# Patient Record
Sex: Female | Born: 1945 | Race: White | Hispanic: No | Smoking: Never smoker
Health system: Southern US, Community
[De-identification: ages and names within clinical notes are randomized; demographics above are authoritative.]

## PROBLEM LIST (undated history)

## (undated) DIAGNOSIS — C439 Malignant melanoma of skin, unspecified: Secondary | ICD-10-CM

## (undated) DIAGNOSIS — I10 Essential (primary) hypertension: Secondary | ICD-10-CM

## (undated) DIAGNOSIS — C801 Malignant (primary) neoplasm, unspecified: Secondary | ICD-10-CM

## (undated) DIAGNOSIS — I4891 Unspecified atrial fibrillation: Secondary | ICD-10-CM

## (undated) DIAGNOSIS — M1990 Unspecified osteoarthritis, unspecified site: Secondary | ICD-10-CM

## (undated) HISTORY — PX: ABDOMINAL HYSTERECTOMY: SHX81

## (undated) HISTORY — PX: TONSILLECTOMY: SUR1361

## (undated) HISTORY — PX: BUNIONECTOMY: SHX129

## (undated) HISTORY — PX: OTHER SURGICAL HISTORY: SHX169

## (undated) HISTORY — PX: LYMPH NODE BIOPSY: SHX201

---

## 2018-01-23 ENCOUNTER — Emergency Department (HOSPITAL_COMMUNITY): Payer: Medicare Other

## 2018-01-23 ENCOUNTER — Encounter (HOSPITAL_COMMUNITY): Payer: Self-pay | Admitting: Obstetrics and Gynecology

## 2018-01-23 ENCOUNTER — Other Ambulatory Visit: Payer: Self-pay

## 2018-01-23 ENCOUNTER — Observation Stay (HOSPITAL_COMMUNITY)
Admission: EM | Admit: 2018-01-23 | Discharge: 2018-01-24 | Disposition: A | Discharge status: Home / Self Care | Payer: Medicare Other | Attending: Interventional Cardiology | Admitting: Interventional Cardiology

## 2018-01-23 DIAGNOSIS — Z7901 Long term (current) use of anticoagulants: Secondary | ICD-10-CM | POA: Diagnosis not present

## 2018-01-23 DIAGNOSIS — I272 Pulmonary hypertension, unspecified: Secondary | ICD-10-CM | POA: Diagnosis not present

## 2018-01-23 DIAGNOSIS — Z79899 Other long term (current) drug therapy: Secondary | ICD-10-CM | POA: Diagnosis not present

## 2018-01-23 DIAGNOSIS — R05 Cough: Secondary | ICD-10-CM | POA: Diagnosis not present

## 2018-01-23 DIAGNOSIS — I110 Hypertensive heart disease with heart failure: Secondary | ICD-10-CM | POA: Insufficient documentation

## 2018-01-23 DIAGNOSIS — Z85820 Personal history of malignant melanoma of skin: Secondary | ICD-10-CM | POA: Diagnosis not present

## 2018-01-23 DIAGNOSIS — I4891 Unspecified atrial fibrillation: Secondary | ICD-10-CM

## 2018-01-23 DIAGNOSIS — I509 Heart failure, unspecified: Secondary | ICD-10-CM

## 2018-01-23 DIAGNOSIS — R0602 Shortness of breath: Secondary | ICD-10-CM | POA: Insufficient documentation

## 2018-01-23 DIAGNOSIS — M1990 Unspecified osteoarthritis, unspecified site: Secondary | ICD-10-CM | POA: Insufficient documentation

## 2018-01-23 DIAGNOSIS — J90 Pleural effusion, not elsewhere classified: Secondary | ICD-10-CM | POA: Insufficient documentation

## 2018-01-23 DIAGNOSIS — I4819 Other persistent atrial fibrillation: Principal | ICD-10-CM | POA: Insufficient documentation

## 2018-01-23 HISTORY — DX: Malignant melanoma of skin, unspecified: C43.9

## 2018-01-23 HISTORY — DX: Malignant (primary) neoplasm, unspecified: C80.1

## 2018-01-23 HISTORY — DX: Essential (primary) hypertension: I10

## 2018-01-23 HISTORY — DX: Unspecified osteoarthritis, unspecified site: M19.90

## 2018-01-23 HISTORY — DX: Unspecified atrial fibrillation: I48.91

## 2018-01-23 LAB — COMPREHENSIVE METABOLIC PANEL
ALT: 29 U/L (ref 0–44)
ANION GAP: 9 (ref 5–15)
AST: 27 U/L (ref 15–41)
Albumin: 4.2 g/dL (ref 3.5–5.0)
Alkaline Phosphatase: 52 U/L (ref 38–126)
BILIRUBIN TOTAL: 0.8 mg/dL (ref 0.3–1.2)
BUN: 18 mg/dL (ref 8–23)
CO2: 25 mmol/L (ref 22–32)
Calcium: 8.7 mg/dL — ABNORMAL LOW (ref 8.9–10.3)
Chloride: 107 mmol/L (ref 98–111)
Creatinine, Ser: 0.7 mg/dL (ref 0.44–1.00)
GFR calc Af Amer: >60 mL/min (ref >60)
GFR calc non Af Amer: >60 mL/min (ref >60)
GLUCOSE: 95 mg/dL (ref 70–99)
POTASSIUM: 3.3 mmol/L — AB (ref 3.5–5.1)
SODIUM: 141 mmol/L (ref 135–145)
Total Protein: 6.3 g/dL — ABNORMAL LOW (ref 6.5–8.1)

## 2018-01-23 LAB — CBC
HEMATOCRIT: 39.9 % (ref 36.0–46.0)
Hemoglobin: 12.9 g/dL (ref 12.0–15.0)
MCH: 33.2 pg (ref 26.0–34.0)
MCHC: 32.3 g/dL (ref 30.0–36.0)
MCV: 102.8 fL — ABNORMAL HIGH (ref 80.0–100.0)
NRBC: 0 % (ref 0.0–0.2)
Platelets: 168 10*3/uL (ref 150–400)
RBC: 3.88 MIL/uL (ref 3.87–5.11)
RDW: 13.9 % (ref 11.5–15.5)
WBC: 10.4 10*3/uL (ref 4.0–10.5)

## 2018-01-23 LAB — PROTIME-INR
INR: 2.34
PROTHROMBIN TIME: 25.3 s — AB (ref 11.4–15.2)

## 2018-01-23 LAB — TROPONIN I: Troponin I: <0.03 ng/mL (ref <0.03)

## 2018-01-23 LAB — BRAIN NATRIURETIC PEPTIDE: B Natriuretic Peptide: 1099.4 pg/mL — ABNORMAL HIGH (ref 0.0–100.0)

## 2018-01-23 MED ORDER — ACETAMINOPHEN 325 MG PO TABS: 650.0000 mg | ORAL_TABLET | ORAL | Status: DC | PRN

## 2018-01-23 MED ORDER — SOTALOL HCL 80 MG PO TABS
80.0000 mg | ORAL_TABLET | Freq: Two times a day (BID) | ORAL | Status: DC
  Administered 2018-01-23 – 2018-01-24 (×2): 80 mg via ORAL
  Filled 2018-01-23 (×3): qty 1

## 2018-01-23 MED ORDER — PANTOPRAZOLE SODIUM 40 MG PO TBEC
40.0000 mg | DELAYED_RELEASE_TABLET | Freq: Every day | ORAL | Status: DC
  Administered 2018-01-24: 40 mg via ORAL
  Filled 2018-01-23: qty 1

## 2018-01-23 MED ORDER — CITALOPRAM HYDROBROMIDE 20 MG PO TABS
20.0000 mg | ORAL_TABLET | Freq: Every day | ORAL | Status: DC
  Administered 2018-01-24: 20 mg via ORAL
  Filled 2018-01-23: qty 1

## 2018-01-23 MED ORDER — POTASSIUM CHLORIDE CRYS ER 20 MEQ PO TBCR
40.0000 meq | EXTENDED_RELEASE_TABLET | Freq: Once | ORAL | Status: AC
  Administered 2018-01-23: 40 meq via ORAL
  Filled 2018-01-23: qty 2

## 2018-01-23 MED ORDER — ACETAMINOPHEN 500 MG PO TABS: 1000.0000 mg | ORAL_TABLET | Freq: Every evening | ORAL | Status: DC | PRN

## 2018-01-23 MED ORDER — B COMPLEX-C PO TABS
1.0000 | ORAL_TABLET | Freq: Every day | ORAL | Status: DC
  Administered 2018-01-24: 1 via ORAL
  Filled 2018-01-23: qty 1

## 2018-01-23 MED ORDER — DILTIAZEM HCL-DEXTROSE 100-5 MG/100ML-% IV SOLN (PREMIX)
5.0000 mg/h | INTRAVENOUS | Status: DC
  Administered 2018-01-23: 5 mg/h via INTRAVENOUS
  Filled 2018-01-23: qty 100

## 2018-01-23 MED ORDER — WARFARIN - PHARMACIST DOSING INPATIENT
Freq: Every day | Status: DC
  Administered 2018-01-23: 22:00:00

## 2018-01-23 MED ORDER — AMOXICILLIN-POT CLAVULANATE 875-125 MG PO TABS
1.0000 | ORAL_TABLET | Freq: Two times a day (BID) | ORAL | Status: DC
  Administered 2018-01-23 – 2018-01-24 (×2): 1 via ORAL
  Filled 2018-01-23 (×3): qty 1

## 2018-01-23 MED ORDER — FUROSEMIDE 10 MG/ML IJ SOLN
40.0000 mg | Freq: Once | INTRAMUSCULAR | Status: AC
  Administered 2018-01-23: 40 mg via INTRAVENOUS
  Filled 2018-01-23: qty 4

## 2018-01-23 MED ORDER — WARFARIN SODIUM 2 MG PO TABS
2.0000 mg | ORAL_TABLET | Freq: Every day | ORAL | Status: DC
  Administered 2018-01-23: 2 mg via ORAL
  Filled 2018-01-23: qty 1

## 2018-01-23 MED ORDER — DILTIAZEM LOAD VIA INFUSION
15.0000 mg | Freq: Once | INTRAVENOUS | Status: AC
  Administered 2018-01-23: 15 mg via INTRAVENOUS
  Filled 2018-01-23: qty 15

## 2018-01-23 MED ORDER — DILTIAZEM HCL-DEXTROSE 100-5 MG/100ML-% IV SOLN (PREMIX)
5.0000 mg/h | INTRAVENOUS | Status: DC
  Administered 2018-01-23: 5 mg/h via INTRAVENOUS
  Filled 2018-01-23 (×2): qty 100

## 2018-01-23 MED ORDER — ONDANSETRON HCL 4 MG/2ML IJ SOLN: 4.0000 mg | Freq: Four times a day (QID) | INTRAMUSCULAR | Status: DC | PRN

## 2018-01-23 MED ORDER — METHYLPREDNISOLONE 4 MG PO TABS
4.0000 mg | ORAL_TABLET | Freq: Every day | ORAL | Status: DC
  Administered 2018-01-24: 4 mg via ORAL
  Filled 2018-01-23: qty 1

## 2018-01-23 MED ORDER — DILTIAZEM HCL ER COATED BEADS 120 MG PO CP24
120.0000 mg | ORAL_CAPSULE | Freq: Once | ORAL | Status: AC
  Administered 2018-01-23: 120 mg via ORAL
  Filled 2018-01-23: qty 1

## 2018-01-23 NOTE — ED Provider Notes (Signed)
[facility] DEPT Provider Note   CSN: [id number] Arrival date & time: 01/23/18  1046     History   Chief Complaint Chief Complaint  Patient presents with  . Cough  . Shortness of Breath  . Atrial Fibrillation    HPI Jacqueline Cochran is a 72 y.o. female.  HPI  6wk of atrial fibrillation, associated shortness of breath, hospitalization, attempted cardioversions For last 5 days shortness of breath has been worse, cough has worsened, productive of white/yellow, not coughing up blood Twinge of chest pain, no significant presesure, chest pain with cough. On augmentin and prednisone since Saturday but no relief. On coumadin No fever No leg pain or swelling Thought laying on left sided made symptoms better, sitting up more  Visiting from [place], flew, had been hospitalized last week at [facility] in [place] 11/13 is flight home  Sotalol 80mg  Sept 20 was first admission/cardioversion, then Oct 25th was second admission/cardioversion [employer] Coumadin When discharged was back in rhythm Had cardioverted and then discharged, then had return, then admitted again last weekend, gave sotalol didn't help, cardioverted again with improved, then came back  Dr. Billy Fischer is Cardiologist in [place]    Past Medical History:  Diagnosis Date  . A-fib ([place])   . Arthritis    RA  . 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 Hypertension   . Melanoma [facility])     Patient Active Problem List   Diagnosis Date Noted  . Atrial fibrillation ([place]) 01/23/2018    Past Surgical History:  Procedure Laterality Date  . ABDOMINAL HYSTERECTOMY    . BUNIONECTOMY    . Cyst removed from toe    . LYMPH NODE BIOPSY    . TONSILLECTOMY       OB History    Gravida      Para      Term      Preterm      AB      Living  1     SAB      TAB      Ectopic      Multiple      Live Births               Home Medications    Prior to Admission medications   Medication Sig Start Date  End Date Taking? Authorizing Provider  acetaminophen (TYLENOL) 500 MG tablet Take 1,000 mg by mouth at bedtime as needed (rest).   Yes [provider]  acetaminophen (TYLENOL) 650 MG CR tablet Take 1,300 mg by mouth at bedtime as needed for pain.   Yes [provider]  amoxicillin-clavulanate (AUGMENTIN) 875-125 MG tablet Take 1 tablet by mouth 2 (two) times daily.   Yes [provider]  B Complex Vitamins (B-COMPLEX/B-12) TABS Take 1 tablet by mouth daily. 06/27/14  Yes [provider]  citalopram (CELEXA) 20 MG tablet Take 20 mg by mouth daily.   Yes [provider]  methylPREDNISolone (MEDROL) 4 MG tablet Take 4 mg by mouth daily.   Yes [provider]  Misc Natural Products (OSTEO BI-FLEX JOINT SHIELD PO) Take 1 tablet by mouth 2 (two) times daily.   Yes [provider]  pantoprazole (PROTONIX) 40 MG tablet Take 40 mg by mouth daily. 06/27/14  Yes [provider]  riTUXimab (RITUXAN) 500 MG/50ML injection Inject 10 mg into the vein. Every 8 weeks   Yes [provider]  sotalol (BETAPACE) 80 MG tablet Take 80 mg by  mouth 2 (two) times daily.   Yes [provider]  TURMERIC PO Take 1 tablet by mouth daily.   Yes [provider]  VITAMIN E PO Take 1 tablet by mouth daily. 06/27/14  Yes [provider]  warfarin (COUMADIN) 2 MG tablet Take 2 mg by mouth daily.   Yes [provider]    Family History Family History  Problem Relation Age of Onset  . Aneurysm Mother   . Stroke Father     Social History Social History   Tobacco Use  . Smoking status: Never Smoker  . Smokeless tobacco: Never Used  Substance Use Topics  . Alcohol use: Yes    Comment: Socially  . Drug use: Never     Allergies   Codeine   Review of Systems Review of Systems  Constitutional: Negative for fever.  HENT: Negative for congestion and sore throat.   Eyes: Negative for visual disturbance.    Respiratory: Positive for cough and shortness of breath.   Cardiovascular: Positive for chest pain. Negative for leg swelling.  Gastrointestinal: Positive for diarrhea (chronic intermittent). Negative for abdominal pain, nausea and vomiting.  Genitourinary: Negative for difficulty urinating.  Musculoskeletal: Negative for back pain and neck pain.  Skin: Negative for rash.  Neurological: Positive for light-headedness. Negative for syncope and headaches.     Physical Exam Updated Vital Signs BP 129/90 (BP Location: Right Arm)   Pulse 82   Temp 97.9 F (36.6 C) (Oral)   Resp (!) 22   Ht 5\' 4"  (1.626 m)   Wt 70.3 kg   SpO2 99%   BMI 26.61 kg/m   Physical Exam  Constitutional: She is oriented to person, place, and time. She appears well-developed and well-nourished. No distress.  HENT:  Head: Normocephalic and atraumatic.  Eyes: Conjunctivae and EOM are normal.  Neck: Normal range of motion.  Cardiovascular: Normal heart sounds and intact distal pulses. An irregularly irregular rhythm present. Tachycardia present. Exam reveals no gallop and no friction rub.  No murmur heard. Pulmonary/Chest: Effort normal and breath sounds normal. No respiratory distress. She has no wheezes. She has no rales.  Abdominal: Soft. She exhibits no distension. There is no tenderness. There is no guarding.  Musculoskeletal: She exhibits no edema or tenderness.  Neurological: She is alert and oriented to person, place, and time.  Skin: Skin is warm and dry. No rash noted. She is not diaphoretic. No erythema.  Nursing note and vitals reviewed.    ED Treatments / Results  Labs (all labs ordered are listed, but only abnormal results are displayed) Labs Reviewed  CBC - Abnormal; Notable for the following components:      Result Value   MCV 102.8 (*)    All other components within normal limits  COMPREHENSIVE METABOLIC PANEL - Abnormal; Notable for the following components:   Potassium 3.3 (*)     Calcium 8.7 (*)    Total Protein 6.3 (*)    All other components within normal limits  BRAIN NATRIURETIC PEPTIDE - Abnormal; Notable for the following components:   B Natriuretic Peptide 1,099.4 (*)    All other components within normal limits  PROTIME-INR - Abnormal; Notable for the following components:   Prothrombin Time 25.3 (*)    All other components within normal limits  TROPONIN I  PROTIME-INR  BASIC METABOLIC PANEL    EKG EKG Interpretation  Date/Time:  Tuesday January 23 2018 11:25:05 EST Ventricular Rate:  128 PR Interval:    QRS  Duration: 87 QT Interval:  281 QTC Calculation: 410 R Axis:   84 Text Interpretation:  Atrial fibrillation Borderline right axis deviation Borderline repolarization abnormality No previous ECGs available Confirmed by Gareth Morgan [id number]) on 01/23/2018 3:08:05 PM   Radiology Dg Chest 2 View  Result Date: 01/23/2018 CLINICAL DATA:  Shortness of breath, cough. EXAM: CHEST - 2 VIEW COMPARISON:  None. FINDINGS: Mild cardiomegaly is noted. Left subclavian catheter is noted with distal tip in expected position of the SVC. Mild central pulmonary vascular congestion is noted. No pneumothorax is noted. Small left pleural effusion is noted. Bony thorax is unremarkable. IMPRESSION: Mild cardiomegaly is noted with mild central pulmonary vascular congestion. Small left pleural effusion is noted. Electronically Signed   By: Marijo Conception, M.D.   On: 01/23/2018 11:23    Procedures .Critical Care Performed by: Gareth Morgan, MD Authorized by: Gareth Morgan, MD   Critical care provider statement:    Critical care time (minutes):  35   Critical care was necessary to treat or prevent imminent or life-threatening deterioration of the following conditions:  Cardiac failure   Critical care was time spent personally by me on the following activities:  Development of treatment plan with patient or surrogate, discussions with consultants, examination of  patient, evaluation of patient's response to treatment, ordering and review of radiographic studies, ordering and review of laboratory studies, pulse oximetry, re-evaluation of patient's condition, review of old charts and ordering and performing treatments and interventions   (including critical care time)  Medications Ordered in ED Medications  sotalol (BETAPACE) tablet 80 mg (has no administration in time range)  citalopram (CELEXA) tablet 20 mg (has no administration in time range)  methylPREDNISolone (MEDROL) tablet 4 mg (has no administration in time range)  pantoprazole (PROTONIX) EC tablet 40 mg (has no administration in time range)  B-Complex/B-12 TABS 1 tablet (has no administration in time range)  amoxicillin-clavulanate (AUGMENTIN) 875-125 MG per tablet 1 tablet (has no administration in time range)  acetaminophen (TYLENOL) tablet 650 mg (has no administration in time range)  ondansetron (ZOFRAN) injection 4 mg (has no administration in time range)  diltiazem (CARDIZEM) 100 mg in dextrose 5% 157mL (1 mg/mL) infusion (has no administration in time range)  warfarin (COUMADIN) tablet 2 mg (has no administration in time range)  Warfarin - Pharmacist Dosing Inpatient (has no administration in time range)  diltiazem (CARDIZEM) 1 mg/mL load via infusion 15 mg (15 mg Intravenous Bolus from Bag 01/23/18 1159)  diltiazem (CARDIZEM CD) 24 hr capsule 120 mg (120 mg Oral Given 01/23/18 1502)  furosemide (LASIX) injection 40 mg (40 mg Intravenous Given 01/23/18 1625)  potassium chloride SA (K-DUR,KLOR-CON) CR tablet 40 mEq (40 mEq Oral Given 01/23/18 1625)     Initial Impression / Assessment and Plan / ED Course  I have reviewed the triage vital signs and the nursing notes.  Pertinent labs & imaging results that were available during my care of the patient were reviewed by me and considered in my medical decision making (see chart for details).     72 year old female with a history of atrial  fibrillation on Coumadin and sotalol, hypertension, lymphoma on Rituxan, presents patient was recently started on antibiotics and steroid dose of breath and cough, however is no improvement.  Chest x-ray shows no evidence of pneumonia nor pneumothorax, does show cardiomegaly with central vascular congestion and left pleural effusion.   EKG shows atrial fibrillation with RVR, rate up to 140s-150s. Blood pressures stable.  Doubt PE  in setting of anticoagulation, no asymmetric leg swelling, and pt with known hx of atrial fibrillation.  BNP elevated to 1099 without prior for comparison.  While cough may represent URI which may have been trigger for atrial fibrillation, given no other URI symptoms, no fever, no WBC, I doubt pneumonia and overall feel likely etiology of her cough and dyspnea is afib with RVR with fluid overload related to atrial fibrillation.   Given diltiazem bolus and gtt.  Rate controlled after given medications, and patient initially reported feeling better, however she then noted having significant dyspnea with minimal activity such as using the commode and was found to have desaturation to 87% on room air with ambulation and HR increase again to 150s. Given lasix and po diltiazem to taper of gtt.  Discussed with Dr. Irish Lack again, will plan on admission to Cardiology service for continued care of symptomatic afib and concern for volume overload.  Attempted to contact Dr. Billy Fischer, her home cardiologist in [place], however did not receive call back.   Pt appropriate for inpatient admission for management of her atrial fibrillation and volume overload.   Final Clinical Impressions(s) / ED Diagnoses   Final diagnoses:  Atrial fibrillation with rapid ventricular response (HCC)  Shortness of breath  Congestive heart failure, unspecified HF chronicity, unspecified heart failure type [facility])    ED Discharge Orders    None       Gareth Morgan, MD 01/23/18 2143

## 2018-01-23 NOTE — ED Notes (Signed)
Bed: HF41 Expected date:  Expected time:  Means of arrival:  Comments: Hold for Cecilie Kicks

## 2018-01-23 NOTE — ED Notes (Signed)
ED TO INPATIENT HANDOFF REPORT  Name/Age/Gender Jacqueline Cochran 72 y.o. female  Code Status   Home/SNF/Other Home  Chief Complaint Cough; Difficulty Breathing  Level of Care/Admitting Diagnosis ED Disposition    ED Disposition Condition Comment   Admit  Hospital Area: [facility] [[id number]]  Level of Care: Telemetry [5]  Diagnosis: Atrial fibrillation ([place]) [427.31.ICD-9-CM]  Admitting Physician: Jettie Booze [[id number]]  Attending Physician: Jettie Booze [[id number]]  PT Class (Do Not Modify): Observation [104]  PT Acc Code (Do Not Modify): Observation [10022]       Medical History Past Medical History:  Diagnosis Date  . A-fib ([place])   . Arthritis    RA  . 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 Hypertension   . Melanoma ([place])     Allergies Allergies  Allergen Reactions  . Codeine     Hives, itching    IV Location/Drains/Wounds Patient Lines/Drains/Airways Status   Active Line/Drains/Airways    Name:   Placement date:   Placement time:   Site:   Days:   Implanted Port 01/23/17   01/23/17    1153    -   365          Labs/Imaging Results for orders placed or performed during the hospital encounter of 01/23/18 (from the past 48 hour(s))  CBC     Status: Abnormal   Collection Time: 01/23/18 11:39 AM  Result Value Ref Range   WBC 10.4 4.0 - 10.5 K/uL   RBC 3.88 3.87 - 5.11 MIL/uL   Hemoglobin 12.9 12.0 - 15.0 g/dL   HCT 39.9 36.0 - 46.0 %   MCV 102.8 (H) 80.0 - 100.0 fL   MCH 33.2 26.0 - 34.0 pg   MCHC 32.3 30.0 - 36.0 g/dL   RDW 13.9 11.5 - 15.5 %   Platelets 168 150 - 400 K/uL   nRBC 0.0 0.0 - 0.2 %    Comment: Performed at [facility], [place] [address] [place], [place] [address]  Comprehensive metabolic panel     Status: Abnormal   Collection Time: 01/23/18 11:39 AM  Result Value Ref Range   Sodium 141 135 - 145 mmol/L   Potassium 3.3 (L) 3.5 - 5.1 mmol/L   Chloride 107 98 - 111 mmol/L   CO2 25 22 - 32 mmol/L   Glucose, Bld 95 70 - 99 mg/dL   BUN 18 8 - 23 mg/dL   Creatinine, Ser 0.70 0.44 - 1.00 mg/dL   Calcium 8.7 (L) 8.9 - 10.3 mg/dL   Total Protein 6.3 (L) 6.5 - 8.1 g/dL   Albumin 4.2 3.5 - 5.0 g/dL   AST 27 15 - 41 U/L   ALT 29 0 - 44 U/L   Alkaline Phosphatase 52 38 - 126 U/L   Total Bilirubin 0.8 0.3 - 1.2 mg/dL   GFR calc non Af Amer >60 >60 mL/min   GFR calc Af Amer >60 >60 mL/min    Comment: (NOTE) The eGFR has been calculated using the CKD EPI equation. This calculation has not been validated in all clinical situations. eGFR's persistently <60 mL/min signify possible Chronic Kidney Disease.    Anion gap 9 5 - 15    Comment: Performed at [facility], [place] [address], [place], [place] [address]  Troponin I     Status: None   Collection Time: 01/23/18 11:39 AM  Result Value Ref Range   Troponin I <0.03 <0.03 ng/mL    Comment:  Performed at [facility], [place] [address], [place], [place] [address]  Brain natriuretic peptide     Status: Abnormal   Collection Time: 01/23/18 11:41 AM  Result Value Ref Range   B Natriuretic Peptide 1,099.4 (H) 0.0 - 100.0 pg/mL    Comment: Performed at [facility], [place] [address] [place], [place] [address]  Protime-INR     Status: Abnormal   Collection Time: 01/23/18 11:41 AM  Result Value Ref Range   Prothrombin Time 25.3 (H) 11.4 - 15.2 seconds   INR 2.34     Comment: Performed at [facility], [place] [address] [place], [place] [address]   Dg Chest 2 View  Result Date: 01/23/2018 CLINICAL DATA:  Shortness of breath, cough. EXAM: CHEST - 2 VIEW COMPARISON:  None. FINDINGS: Mild cardiomegaly is noted. Left subclavian catheter is noted with distal tip in expected position of the SVC. Mild central pulmonary vascular congestion is noted. No pneumothorax is noted. Small left pleural effusion is noted. Bony thorax is unremarkable. IMPRESSION: Mild cardiomegaly is noted with mild  central pulmonary vascular congestion. Small left pleural effusion is noted. Electronically Signed   By: Marijo Conception, M.D.   On: 01/23/2018 11:23   EKG Interpretation  Date/Time:  Tuesday January 23 2018 11:25:05 EST Ventricular Rate:  128 PR Interval:    QRS Duration: 87 QT Interval:  281 QTC Calculation: 410 R Axis:   84 Text Interpretation:  Atrial fibrillation Borderline right axis deviation Borderline repolarization abnormality No previous ECGs available Confirmed by Gareth Morgan [phone]) on 01/23/2018 3:08:05 PM   Pending Labs Unresulted Labs (From admission, onward)   None      Vitals/Pain Today's Vitals   01/23/18 1214 01/23/18 1324 01/23/18 1502 01/23/18 1514  BP:  117/90 124/87 (!) 127/95  Pulse:  85  (!) 47  Resp:  (!) 27  (!) 28  Temp:      SpO2:  91%  94%  Weight: 70.3 kg     Height: 5' 4"  (1.626 m)       Isolation Precautions No active isolations  Medications Medications  diltiazem (CARDIZEM) 1 mg/mL load via infusion 15 mg (15 mg Intravenous Bolus from Bag 01/23/18 1159)  diltiazem (CARDIZEM CD) 24 hr capsule 120 mg (120 mg Oral Given 01/23/18 1502)  furosemide (LASIX) injection 40 mg (40 mg Intravenous Given 01/23/18 1625)  potassium chloride SA (K-DUR,KLOR-CON) CR tablet 40 mEq (40 mEq Oral Given 01/23/18 1625)    Mobility walks with person assist

## 2018-01-23 NOTE — H&P (Signed)
Cardiology Admission History and Physical:   Patient ID: Jacqueline Cochran MRN: [id number]; DOB: [birth year]   Admission date: 01/23/2018  Primary Care Provider: System, [place] Not In Primary Cardiologist: [place]  Chief Complaint:  afib  Patient Profile:   Jacqueline Cochran is a 72 y.o. female with persistent atrial fibrillation with sent recent failed cardioversion, mitral valve prolapse, rheumatoid arthritis and hypertension presented for evaluation of shortness of breath and found to have a A. fib RVR.  History of Present Illness:   Jacqueline Cochran has a history of atrial fibrillation dating back 10 years ago.  She was maintaining sinus rhythm up until 2 months ago when noted in symptomatic atrial fibrillation.  She failed cardioversion 12/08/2017 and 01/15/2018.  Both time she stayed in minus rhythm for 1 day.  Patient was on metoprolol and digoxin after first cardioversion September 2019.  She was loaded with sotalol in October but did not cardiovert chemically requiring cardioversion as above.  Both time failed.  Patient states that she had reassuring stress test and echocardiogram (with mild weak heart function) in past 2 months.  Patient is visiting her sister since last week.  She has ongoing cough and her primary care provider from [place] prescribe her Augmentin and taper prednisone for 10 days.  Today is her fourth day.  He continues to have shortness of breath, cough and intermittent palpitation leading to ER presentation at [facility] long.  He was noted in atrial fibrillation with rapid ventricular rate.  Rate now improved on IV Cardizem.  She takes Coumadin for anticoagulation.  Chest x-ray showed mild cardiomegaly with pulmonary vascular congestion.  BNP 1100.  INR 2.34.  Potassium 3.3.  Given IV Lasix 40 mg x 1. Kdur 64meq x 1.  Denies illicit drug use or tobacco smoking.  Drinks occasionally few times per week in low moderation.  Mother had history of atrial fibrillation and eventually died of  stroke.   Past Medical History:  Diagnosis Date  . A-fib ([place])   . Arthritis    RA  . 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 Hypertension   . Melanoma [facility])     Past Surgical History:  Procedure Laterality Date  . ABDOMINAL HYSTERECTOMY    . BUNIONECTOMY    . Cyst removed from toe    . LYMPH NODE BIOPSY    . TONSILLECTOMY       Medications Prior to Admission: Prior to Admission medications   Medication Sig Start Date End Date Taking? Authorizing Provider  acetaminophen (TYLENOL) 500 MG tablet Take 1,000 mg by mouth at bedtime as needed (rest).   Yes [provider]  acetaminophen (TYLENOL) 650 MG CR tablet Take 1,300 mg by mouth at bedtime as needed for pain.   Yes [provider]  amoxicillin-clavulanate (AUGMENTIN) 875-125 MG tablet Take 1 tablet by mouth 2 (two) times daily.   Yes [provider]  B Complex Vitamins (B-COMPLEX/B-12) TABS Take 1 tablet by mouth daily. 06/27/14  Yes [provider]  citalopram (CELEXA) 20 MG tablet Take 20 mg by mouth daily.   Yes [provider]  methylPREDNISolone (MEDROL) 4 MG tablet Take 4 mg by mouth daily.   Yes [provider]  Misc Natural Products (OSTEO BI-FLEX JOINT SHIELD PO) Take 1 tablet by mouth 2 (two) times daily.   Yes [provider]  pantoprazole (PROTONIX) 40 MG tablet Take 40 mg by mouth daily. 06/27/14  Yes [provider]  riTUXimab (RITUXAN) 500 MG/50ML injection Inject 10  mg into the vein. Every 8 weeks   Yes [provider]  sotalol (BETAPACE) 80 MG tablet Take 80 mg by mouth 2 (two) times daily.   Yes [provider]  TURMERIC PO Take 1 tablet by mouth daily.   Yes [provider]  VITAMIN E PO Take 1 tablet by mouth daily. 06/27/14  Yes [provider]  warfarin (COUMADIN) 2 MG tablet Take 2 mg by mouth daily.   Yes [provider]     Allergies:    Allergies  Allergen Reactions  . Codeine     Hives, itching     Social History:   Social History   Socioeconomic History  . Marital status: Widowed    Spouse name: Not on file  . Number of children: Not on file  . Years of education: Not on file  . Highest education level: Not on file  Occupational History  . Not on file  Social Needs  . Financial resource strain: Not on file  . Food insecurity:    Worry: Not on file    Inability: Not on file  . Transportation needs:    Medical: Not on file    Non-medical: Not on file  Tobacco Use  . Smoking status: Never Smoker  . Smokeless tobacco: Never Used  Substance and Sexual Activity  . Alcohol use: Yes    Comment: Socially  . Drug use: Never  . Sexual activity: Not Currently  Lifestyle  . Physical activity:    Days per week: Not on file    Minutes per session: Not on file  . Stress: Not on file  Relationships  . Social connections:    Talks on phone: Not on file    Gets together: Not on file    Attends religious service: Not on file    Active member of club or organization: Not on file    Attends meetings of clubs or organizations: Not on file    Relationship status: Not on file  . Intimate partner violence:    Fear of current or ex partner: Not on file    Emotionally abused: Not on file    Physically abused: Not on file    Forced sexual activity: Not on file  Other Topics Concern  . Not on file  Social History Narrative  . Not on file    Family History:   The patient's family history includes Aneurysm in her mother; Stroke in her father.    ROS:  Please see the history of present illness.  All other ROS reviewed and negative.     Physical Exam/Data:   Vitals:   01/23/18 1647 01/23/18 1648 01/23/18 1649 01/23/18 1853  BP:    129/90  Pulse: (!) 44 100 (!) 105 82  Resp: (!) 25 (!) 23 (!) 27 (!) 22  Temp:    97.9 F (36.6 C)  TempSrc:    Oral  SpO2: 93% 95% 94% 99%  Weight:      Height:        Intake/Output Summary (Last 24 hours) at 01/23/2018 1919 Last data  filed at 01/23/2018 1625 Gross per 24 hour  Intake 36.49 ml  Output -  Net 36.49 ml   Filed Weights   01/23/18 1214  Weight: 70.3 kg   Body mass index is 26.61 kg/m.  General:  Well nourished, well developed, in no acute distress HEENT: normal Lymph: no adenopathy Neck: no JVD Endocrine:  No thryomegaly Vascular: No carotid bruits;  FA pulses 2+ bilaterally without bruits  Cardiac:  normal S1, S2; RRR; no murmur  Lungs:  clear to auscultation bilaterally, no wheezing, rhonchi or rales  Abd: soft, nontender, no hepatomegaly  Ext: no edema Musculoskeletal:  No deformities, BUE and BLE strength normal and equal Skin: warm and dry  Neuro:  CNs 2-12 intact, no focal abnormalities noted Psych:  Normal affect    EKG:  The ECG that was done today was personally reviewed and demonstrates atrial fibrillation at rate of 128 bpm  Relevant CV Studies: As above  Laboratory Data:  Chemistry Recent Labs  Lab 01/23/18 1139  NA 141  K 3.3*  CL 107  CO2 25  GLUCOSE 95  BUN 18  CREATININE 0.70  CALCIUM 8.7*  GFRNONAA >60  GFRAA >60  ANIONGAP 9    Recent Labs  Lab 01/23/18 1139  PROT 6.3*  ALBUMIN 4.2  AST 27  ALT 29  ALKPHOS 52  BILITOT 0.8   Hematology Recent Labs  Lab 01/23/18 1139  WBC 10.4  RBC 3.88  HGB 12.9  HCT 39.9  MCV 102.8*  MCH 33.2  MCHC 32.3  RDW 13.9  PLT 168   Cardiac Enzymes Recent Labs  Lab 01/23/18 1139  TROPONINI <0.03   No results for input(s): TROPIPOC in the last 168 hours.  BNP Recent Labs  Lab 01/23/18 1141  BNP 1,099.4*    DDimer No results for input(s): DDIMER in the last 168 hours.  Radiology/Studies:  Dg Chest 2 View  Result Date: 01/23/2018 CLINICAL DATA:  Shortness of breath, cough. EXAM: CHEST - 2 VIEW COMPARISON:  None. FINDINGS: Mild cardiomegaly is noted. Left subclavian catheter is noted with distal tip in expected position of the SVC. Mild central pulmonary vascular congestion is noted. No pneumothorax is  noted. Small left pleural effusion is noted. Bony thorax is unremarkable. IMPRESSION: Mild cardiomegaly is noted with mild central pulmonary vascular congestion. Small left pleural effusion is noted. Electronically Signed   By: Marijo Conception, M.D.   On: 01/23/2018 11:23    Assessment and Plan:   1. Persistent atrial fibrillation with rapid ventricular rate Recently failed cardioversion x2.  About a week ago she loaded with sotalol and then failed cardioversion most recently 10/28.  Patient denies prior trial of any antiarrhythmic.  She is compliant with Coumadin.  INR therapeutic here.  Rate now controlled on IV Cardizem..  Will continue sotalol at current dose.  Continue IV Cardizem and Coumadin.  EP consult in morning for ablation versus another antiarrhythmic. Will repeat echo.   2. Possible bronchitis - will continue home meds.   Patient will be on EP service tomorrow with consult.   Severity of Illness: The appropriate patient status for this patient is INPATIENT. Inpatient status is judged to be reasonable and necessary in order to provide the required intensity of service to ensure the patient's safety. The patient's presenting symptoms, physical exam findings, and initial radiographic and laboratory data in the context of their chronic comorbidities is felt to place them at high risk for further clinical deterioration. Furthermore, it is not anticipated that the patient will be medically stable for discharge from the hospital within 2 midnights of admission. The following factors support the patient status of inpatient.   " The patient's presenting symptoms include  SOB. " The worrisome physical exam findings include now " The initial radiographic and laboratory data are worrisome because of  " The chronic co-morbidities include RA   * I certify  that at the point of admission it is my clinical judgment that the patient will require inpatient hospital care spanning beyond 2 midnights  from the point of admission due to high intensity of service, high risk for further deterioration and high frequency of surveillance required.*    For questions or updates, please contact [place] Please consult [email or web address] for contact info under        Jarrett Soho, PA  01/23/2018 7:19 PM

## 2018-01-23 NOTE — ED Notes (Signed)
RN ambulated pt in hallway, pt became short of breath during the wall and her heart rate increased to 158 during ambulation and stayed increased between 110-134 during walk. Pt's oxygen saturation declined to 87% on RA and stayed between 87%-94% on RA.

## 2018-01-23 NOTE — ED Triage Notes (Signed)
Pt reports she has had an extensive battle with a-fib and has had two cardioversions that have been unsuccessful. Pt has been short of breath and is coughing. Pt has a strong cough. Pt has been on amoxicillin and prednisone with no relief

## 2018-01-23 NOTE — Progress Notes (Signed)
ANTICOAGULATION CONSULT NOTE - Initial Consult  Pharmacy Consult for warfarin Indication: atrial fibrillation  Allergies  Allergen Reactions  . Codeine     Hives, itching    Patient Measurements: Height: 5\' 4"  (162.6 cm) Weight: 155 lb (70.3 kg) IBW/kg (Calculated) : 54.7 Heparin Dosing Weight: 69 kg  Vital Signs: Temp: 97.9 F (36.6 C) (11/05 1853) Temp Source: Oral (11/05 1853) BP: 129/90 (11/05 1853) Pulse Rate: 82 (11/05 1853)  Labs: Recent Labs    01/23/18 1139 01/23/18 1141  HGB 12.9  --   HCT 39.9  --   PLT 168  --   LABPROT  --  25.3*  INR  --  2.34  CREATININE 0.70  --   TROPONINI <0.03  --     Estimated Creatinine Clearance: 61.1 mL/min (by C-G formula based on SCr of 0.7 mg/dL).   Medical History: Past Medical History:  Diagnosis Date  . A-fib ([place])   . Arthritis    RA  . 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 Hypertension   . Melanoma ([place])     Medications:  Medications Prior to Admission  Medication Sig Dispense Refill Last Dose  . acetaminophen (TYLENOL) 500 MG tablet Take 1,000 mg by mouth at bedtime as needed (rest).   01/22/2018 at Unknown time  . acetaminophen (TYLENOL) 650 MG CR tablet Take 1,300 mg by mouth at bedtime as needed for pain.   Past Month at Unknown time  . amoxicillin-clavulanate (AUGMENTIN) 875-125 MG tablet Take 1 tablet by mouth 2 (two) times daily.   01/23/2018 at Unknown time  . B Complex Vitamins (B-COMPLEX/B-12) TABS Take 1 tablet by mouth daily.   01/23/2018 at Unknown time  . citalopram (CELEXA) 20 MG tablet Take 20 mg by mouth daily.   01/23/2018 at Unknown time  . methylPREDNISolone (MEDROL) 4 MG tablet Take 4 mg by mouth daily.   01/23/2018 at Unknown time  . Misc Natural Products (OSTEO BI-FLEX JOINT SHIELD PO) Take 1 tablet by mouth 2 (two) times daily.   01/23/2018 at Unknown time  . pantoprazole (PROTONIX) 40 MG tablet Take 40 mg by mouth daily.   01/23/2018 at Unknown time  . riTUXimab (RITUXAN) 500 MG/50ML injection  Inject 10 mg into the vein. Every 8 weeks   01/16/2018  . sotalol (BETAPACE) 80 MG tablet Take 80 mg by mouth 2 (two) times daily.   01/23/2018 at 945 am  . TURMERIC PO Take 1 tablet by mouth daily.   01/22/2018 at Unknown time  . VITAMIN E PO Take 1 tablet by mouth daily.   01/23/2018 at Unknown time  . warfarin (COUMADIN) 2 MG tablet Take 2 mg by mouth daily.   01/22/2018 at [phone] pm    Assessment: 42 yoF with persistent atrial fibrillation with recent failed cardioversion, presented with SOB and was in AFib RVR. Pharmacy consulted to continue warfarin. Baseline INR therapeutic at 2.34. CBC wnl. Last dose 11/4 PTA.  PTA warfarin dose warfarin 2 mg PO daily   Goal of Therapy:  INR 2-3 Monitor platelets by anticoagulation protocol: Yes   Plan:  Continue warfarin 2 mg PO daily Monitor daily INR, CBC, clinical course, s/sx of bleed, PO intake, DDI    Thank you for allowing [place] to participate in this patients care.   Jens Som, PharmD Please utilize Amion (under [place]) for appropriate number for your unit pharmacist. 01/23/2018 8:00 PM

## 2018-01-23 NOTE — ED Notes (Signed)
Pt assisted up to bedside toilet. Pts HR increased to 120bpm. Pts O2 sat dropped to 89% and pt had an increase in shortness of breath. Pt quickly returned to baseline

## 2018-01-24 ENCOUNTER — Observation Stay (HOSPITAL_BASED_OUTPATIENT_CLINIC_OR_DEPARTMENT_OTHER): Payer: Medicare Other

## 2018-01-24 DIAGNOSIS — E876 Hypokalemia: Secondary | ICD-10-CM

## 2018-01-24 DIAGNOSIS — I509 Heart failure, unspecified: Secondary | ICD-10-CM

## 2018-01-24 DIAGNOSIS — I4819 Other persistent atrial fibrillation: Secondary | ICD-10-CM

## 2018-01-24 DIAGNOSIS — I351 Nonrheumatic aortic (valve) insufficiency: Secondary | ICD-10-CM

## 2018-01-24 DIAGNOSIS — R0602 Shortness of breath: Secondary | ICD-10-CM

## 2018-01-24 LAB — ECHOCARDIOGRAM COMPLETE
Height: 64 in
Weight: 2430.35 oz

## 2018-01-24 LAB — MRSA PCR SCREENING: MRSA BY PCR: POSITIVE — AB

## 2018-01-24 LAB — BASIC METABOLIC PANEL
Anion gap: 6 (ref 5–15)
BUN: 17 mg/dL (ref 8–23)
CHLORIDE: 108 mmol/L (ref 98–111)
CO2: 29 mmol/L (ref 22–32)
CREATININE: 0.81 mg/dL (ref 0.44–1.00)
Calcium: 8.5 mg/dL — ABNORMAL LOW (ref 8.9–10.3)
GFR calc Af Amer: >60 mL/min (ref >60)
GFR calc non Af Amer: >60 mL/min (ref >60)
Glucose, Bld: 93 mg/dL (ref 70–99)
POTASSIUM: 3 mmol/L — AB (ref 3.5–5.1)
Sodium: 143 mmol/L (ref 135–145)

## 2018-01-24 LAB — PROTIME-INR
INR: 2.2
Prothrombin Time: 24.2 seconds — ABNORMAL HIGH (ref 11.4–15.2)

## 2018-01-24 MED ORDER — DILTIAZEM HCL 60 MG PO TABS
30.0000 mg | ORAL_TABLET | Freq: Four times a day (QID) | ORAL | Status: DC
  Administered 2018-01-24 (×2): 30 mg via ORAL
  Filled 2018-01-24 (×2): qty 1

## 2018-01-24 MED ORDER — DILTIAZEM HCL 30 MG PO TABS: 30.0000 mg | ORAL_TABLET | Freq: Four times a day (QID) | ORAL | 0 refills | Status: AC

## 2018-01-24 MED ORDER — MUPIROCIN 2 % EX OINT
1.0000 "application " | TOPICAL_OINTMENT | Freq: Two times a day (BID) | CUTANEOUS | Status: DC
  Administered 2018-01-24: 1 via NASAL
  Filled 2018-01-24: qty 22

## 2018-01-24 MED ORDER — HEPARIN SOD (PORK) LOCK FLUSH 100 UNIT/ML IV SOLN
500.0000 [IU] | INTRAVENOUS | Status: AC | PRN
  Administered 2018-01-24: 500 [IU]

## 2018-01-24 MED ORDER — POTASSIUM CHLORIDE CRYS ER 20 MEQ PO TBCR
40.0000 meq | EXTENDED_RELEASE_TABLET | ORAL | Status: AC
  Administered 2018-01-24 (×2): 40 meq via ORAL
  Filled 2018-01-24 (×2): qty 2

## 2018-01-24 MED ORDER — DILTIAZEM HCL ER COATED BEADS 120 MG PO CP24
120.0000 mg | ORAL_CAPSULE | Freq: Every day | ORAL | Status: DC
  Administered 2018-01-24: 120 mg via ORAL
  Filled 2018-01-24: qty 1

## 2018-01-24 MED ORDER — DILTIAZEM HCL ER COATED BEADS 120 MG PO CP24: 120.0000 mg | ORAL_CAPSULE | Freq: Every day | ORAL | 11 refills | Status: AC

## 2018-01-24 MED ORDER — CHLORHEXIDINE GLUCONATE CLOTH 2 % EX PADS: 6.0000 | MEDICATED_PAD | Freq: Every day | CUTANEOUS | Status: DC

## 2018-01-24 NOTE — Progress Notes (Signed)
Informed by patient's RN that diltiazem gtt had been held since 3 AM for low HR. Patient's HR currently 60s-80s in atrial fibrillation.   Diltiazem gtt replaced with low dose PO diltiazem for ongoing rate control.   Lauren K. Marletta Lor, MD

## 2018-01-24 NOTE — Consult Note (Addendum)
ELECTROPHYSIOLOGY CONSULT NOTE    Patient ID: Jacqueline Cochran MRN: [id number], DOB/AGE: May 16, 1945 72 y.o.  Admit date: 01/23/2018 Date of Consult: 01/24/2018  Primary Physician: System, Pcp Not In Primary Cardiologist: [place]  Patient Profile: Jacqueline Cochran is a 72 y.o. female with a history of persistent atrial fibrillation, rheumatoid arthritis and hypertension who is being seen today for the evaluation of AF at the request of Dr Radford Pax.  HPI:  Jacqueline Cochran is a 72 y.o. female with the above past medical history. She has a 10 year history of AF and recently has had worsening symptoms and recurrence. She was seen by her physicians in [place] and started on Sotalol. She is here in [place] visiting family for vacation and developed recurrent atrial fibrillation with RVR and associated shortness of breath with exertion.  She presented to the ER for further evaluation. She states that her physicians in [place] are considering ablation for her AF.  She has been placed on Diltiazem and is significantly improved this morning with controlled V rates.  Shortness of breath is resolved.  V rates are in the 80's.  EP has been asked to evaluate for treatment options.   Echo has been done this admission and is pending.   She denies chest pain, PND, orthopnea, nausea, vomiting, dizziness, syncope, edema, weight gain, or early satiety.  Past Medical History:  Diagnosis Date  . A-fib ([place])   . Arthritis    RA  . 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 Hypertension   . Melanoma [facility])      Surgical History:  Past Surgical History:  Procedure Laterality Date  . ABDOMINAL HYSTERECTOMY    . BUNIONECTOMY    . Cyst removed from toe    . LYMPH NODE BIOPSY    . TONSILLECTOMY       Medications Prior to Admission  Medication Sig Dispense Refill Last Dose  . acetaminophen (TYLENOL) 500 MG tablet Take 1,000 mg by mouth at bedtime as needed (rest).   01/22/2018 at Unknown time  . acetaminophen (TYLENOL) 650 MG CR tablet  Take 1,300 mg by mouth at bedtime as needed for pain.   Past Month at Unknown time  . amoxicillin-clavulanate (AUGMENTIN) 875-125 MG tablet Take 1 tablet by mouth 2 (two) times daily.   01/23/2018 at Unknown time  . B Complex Vitamins (B-COMPLEX/B-12) TABS Take 1 tablet by mouth daily.   01/23/2018 at Unknown time  . citalopram (CELEXA) 20 MG tablet Take 20 mg by mouth daily.   01/23/2018 at Unknown time  . methylPREDNISolone (MEDROL) 4 MG tablet Take 4 mg by mouth daily.   01/23/2018 at Unknown time  . Misc Natural Products (OSTEO BI-FLEX JOINT SHIELD PO) Take 1 tablet by mouth 2 (two) times daily.   01/23/2018 at Unknown time  . pantoprazole (PROTONIX) 40 MG tablet Take 40 mg by mouth daily.   01/23/2018 at Unknown time  . riTUXimab (RITUXAN) 500 MG/50ML injection Inject 10 mg into the vein. Every 8 weeks   01/16/2018  . sotalol (BETAPACE) 80 MG tablet Take 80 mg by mouth 2 (two) times daily.   01/23/2018 at 945 am  . TURMERIC PO Take 1 tablet by mouth daily.   01/22/2018 at Unknown time  . VITAMIN E PO Take 1 tablet by mouth daily.   01/23/2018 at Unknown time  . warfarin (COUMADIN) 2 MG tablet Take 2 mg by mouth daily.   01/22/2018 at [phone] pm    Inpatient Medications:  . amoxicillin-clavulanate  1 tablet Oral BID  . B-complex with vitamin C  1 tablet Oral Daily  . Chlorhexidine Gluconate Cloth  6 each Topical Q0600  . citalopram  20 mg Oral Daily  . diltiazem  30 mg Oral Q6H  . methylPREDNISolone  4 mg Oral Daily  . mupirocin ointment  1 application Nasal BID  . pantoprazole  40 mg Oral Daily  . potassium chloride  40 mEq Oral Q2H  . sotalol  80 mg Oral BID  . warfarin  2 mg Oral q1800  . Warfarin - Pharmacist Dosing Inpatient   Does not apply q1800    Allergies:  Allergies  Allergen Reactions  . Codeine     Hives, itching    Social History   Socioeconomic History  . Marital status: Widowed    Spouse name: Not on file  . Number of children: Not on file  . Years of education:  Not on file  . Highest education level: Not on file  Occupational History  . Not on file  Social Needs  . Financial resource strain: Not on file  . Food insecurity:    Worry: Not on file    Inability: Not on file  . Transportation needs:    Medical: Not on file    Non-medical: Not on file  Tobacco Use  . Smoking status: Never Smoker  . Smokeless tobacco: Never Used  Substance and Sexual Activity  . Alcohol use: Yes    Comment: Socially  . Drug use: Never  . Sexual activity: Not Currently  Lifestyle  . Physical activity:    Days per week: Not on file    Minutes per session: Not on file  . Stress: Not on file  Relationships  . Social connections:    Talks on phone: Not on file    Gets together: Not on file    Attends religious service: Not on file    Active member of club or organization: Not on file    Attends meetings of clubs or organizations: Not on file    Relationship status: Not on file  . Intimate partner violence:    Fear of current or ex partner: Not on file    Emotionally abused: Not on file    Physically abused: Not on file    Forced sexual activity: Not on file  Other Topics Concern  . Not on file  Social History Narrative  . Not on file     Family History  Problem Relation Age of Onset  . Aneurysm Mother   . Stroke Father      Review of Systems: All other systems reviewed and are otherwise negative except as noted above.  Physical Exam: Vitals:   01/24/18 0557 01/24/18 0600 01/24/18 0700 01/24/18 0752  BP: 122/79 122/79 119/80 133/83  Pulse:  (!) 42 67   Resp:  (!) 23 17   Temp:    97.7 F (36.5 C)  TempSrc:    Oral  SpO2:  96% 94%   Weight:      Height:        GEN- The patient is well appearing, alert and oriented x 3 today.   HEENT: normocephalic, atraumatic; sclera clear, conjunctiva pink; hearing intact; oropharynx clear; neck supple Lungs- Clear to ausculation bilaterally, normal work of breathing.  No wheezes, rales,  rhonchi Heart- Irregular rate and rhythm  GI- soft, non-tender, non-distended, bowel sounds present Extremities- no clubbing, cyanosis, or edema  MS- no significant deformity or atrophy Skin- warm  and dry, no rash or lesion Psych- euthymic mood, full affect Neuro- strength and sensation are intact  Labs:   Lab Results  Component Value Date   WBC 10.4 01/23/2018   HGB 12.9 01/23/2018   HCT 39.9 01/23/2018   MCV 102.8 (H) 01/23/2018   PLT 168 01/23/2018    Recent Labs  Lab 01/23/18 1139 01/24/18 0549  NA 141 143  K 3.3* 3.0*  CL 107 108  CO2 25 29  BUN 18 17  CREATININE 0.70 0.81  CALCIUM 8.7* 8.5*  PROT 6.3*  --   BILITOT 0.8  --   ALKPHOS 52  --   ALT 29  --   AST 27  --   GLUCOSE 95 93      Radiology/Studies: Dg Chest 2 View  Result Date: 01/23/2018 CLINICAL DATA:  Shortness of breath, cough. EXAM: CHEST - 2 VIEW COMPARISON:  None. FINDINGS: Mild cardiomegaly is noted. Left subclavian catheter is noted with distal tip in expected position of the SVC. Mild central pulmonary vascular congestion is noted. No pneumothorax is noted. Small left pleural effusion is noted. Bony thorax is unremarkable. IMPRESSION: Mild cardiomegaly is noted with mild central pulmonary vascular congestion. Small left pleural effusion is noted. Electronically Signed   By: Marijo Conception, M.D.   On: 01/23/2018 11:23    EKG:AF with RVR (personally reviewed)  TELEMETRY: rate controlled AF (personally reviewed)  Assessment/Plan: 1.  Persistent atrial fibrillation Much improved with better rate control She was just loaded on Sotalol last week. For now, Jacqueline Cochran continue as she is symptomatically improved. Could consider alternative AAD therapy but she is traveling back to [facility] next week and would like to follow up with her physicians there. Jacqueline Cochran plan to ambulate today and if she continues to feel well Jacqueline Cochran send home with increased dose of CCB along with prn Diltiazem to take for RVR Continue Warfarin  for CHADS2VASC of 3 We have encouraged her to consider PVI with her physicians in [place]. Return precautions given.   2.  Hypokalemia Jacqueline Cochran replete prior to DC   For questions or updates, please contact [place] Please consult [email or web address] for contact info under Cardiology/STEMI.  Signed, Jacqueline Marshall, NP 01/24/2018 9:51 AM  I have seen and examined this patient with Jacqueline Cochran.  Agree with above, note added to reflect my findings.  On exam, iRRR, no murmurs, lungs clear. Patient admitted with AF and RVR. Switched to PO dlitiazem overnight with improvement in HR. Plan to ambulate today. If rate well controlled, plan for discharge.    Jacqueline Oshel M. Dezmen Alcock MD 01/24/2018 12:12 PM

## 2018-01-24 NOTE — Discharge Summary (Addendum)
ELECTROPHYSIOLOGY PROCEDURE DISCHARGE SUMMARY    Patient ID: Jacqueline Cochran,  MRN: [id number], DOB/AGE: 04/18/1945 72 y.o.  Admit date: 01/23/2018 Discharge date: 01/24/2018  Primary Cardiologist: [facility]  Primary Discharge Diagnosis:  1.  Persistent atrial fibrillation with RVR  Secondary Discharge Diagnosis: 1.  HTN   Allergies  Allergen Reactions  . Codeine     Hives, itching   Brief HPI/Hospital Course:  Jacqueline Cochran is a 72 y.o. female with the above past medical history. She has a 10 year history of AF and recently has had worsening symptoms and recurrence. She was seen by her physicians in [place] and started on Sotalol. She is here in [place] visiting family for vacation and developed recurrent atrial fibrillation with RVR and associated shortness of breath with exertion.  She presented to the ER for further evaluation. She states that her physicians in [place] are considering ablation for her AF.  She has been placed on Diltiazem and is significantly improved this morning with controlled V rates.  Shortness of breath is resolved.  V rates are in the 80's. She was seen by Dr Curt Bears this morning.  plan to ambulate and if V rates are controlled and asymptomatic  plan to discharge home today with planned follow up in [place] with her regular cardiologists.  Continue Warfarin for CHADS2VASC of 3.  Return precautions given.  Could consider alternative AAD therapy but as she is symptomatically improved, makes most sense to let her follow up with physicians who know her well at home.   Physical Exam: Vitals:   01/24/18 0557 01/24/18 0600 01/24/18 0700 01/24/18 0752  BP: 122/79 122/79 119/80 133/83  Pulse:  (!) 42 67   Resp:  (!) 23 17   Temp:    97.7 F (36.5 C)  TempSrc:    Oral  SpO2:  96% 94%   Weight:      Height:       Labs:   Lab Results  Component Value Date   WBC 10.4 01/23/2018   HGB 12.9 01/23/2018   HCT 39.9 01/23/2018   MCV 102.8 (H) 01/23/2018   PLT 168  01/23/2018    Recent Labs  Lab 01/23/18 1139 01/24/18 0549  NA 141 143  K 3.3* 3.0*  CL 107 108  CO2 25 29  BUN 18 17  CREATININE 0.70 0.81  CALCIUM 8.7* 8.5*  PROT 6.3*  --   BILITOT 0.8  --   ALKPHOS 52  --   ALT 29  --   AST 27  --   GLUCOSE 95 93     Discharge Medications:  Allergies as of 01/24/2018      Reactions   Codeine    Hives, itching      Medication List    TAKE these medications   acetaminophen 650 MG CR tablet Commonly known as:  TYLENOL Take 1,300 mg by mouth at bedtime as needed for pain.   acetaminophen 500 MG tablet Commonly known as:  TYLENOL Take 1,000 mg by mouth at bedtime as needed (rest).   amoxicillin-clavulanate 875-125 MG tablet Commonly known as:  AUGMENTIN Take 1 tablet by mouth 2 (two) times daily.   B-Complex/B-12 Tabs Take 1 tablet by mouth daily.   citalopram 20 MG tablet Commonly known as:  CELEXA Take 20 mg by mouth daily.   diltiazem 120 MG 24 hr capsule Commonly known as:  CARDIZEM CD Take 1 capsule (120 mg total) by mouth daily.   diltiazem 30 MG tablet  Commonly known as:  CARDIZEM Take 1 tablet (30 mg total) by mouth every 6 (six) hours.   methylPREDNISolone 4 MG tablet Commonly known as:  MEDROL Take 4 mg by mouth daily.   OSTEO BI-FLEX JOINT SHIELD PO Take 1 tablet by mouth 2 (two) times daily.   pantoprazole 40 MG tablet Commonly known as:  PROTONIX Take 40 mg by mouth daily.   riTUXimab 500 MG/50ML injection Commonly known as:  RITUXAN Inject 10 mg into the vein. Every 8 weeks   sotalol 80 MG tablet Commonly known as:  BETAPACE Take 80 mg by mouth 2 (two) times daily.   TURMERIC PO Take 1 tablet by mouth daily.   VITAMIN E PO Take 1 tablet by mouth daily.   warfarin 2 MG tablet Commonly known as:  COUMADIN Take 2 mg by mouth daily.       Disposition: Pt is being discharged home today in good condition.  Follow-up Information    Cardiologist in [place] Follow up in 1 week(s).   Why:   As soon as possible when you return home for rhythm check and INR check. Also need repeat labs for Potassium (level was low while in hospital here)          Duration of Discharge Encounter: Greater than 30 minutes including physician time.  Signed, Chanetta Marshall, NP 01/24/2018 10:03 AM   I have seen and examined this patient with Chanetta Marshall.  Agree with above, note added to reflect my findings.  On exam, iRRR, no murmurs, lungs clear. Admit for AF and RVR. Put on PO diltiazem with improvement in rates and improvement in SOB. Plan for discharge today with follow up in clinic.     M.  MD 01/24/2018 12:19 PM

## 2018-01-24 NOTE — Discharge Instructions (Signed)
Follow up with cardiologist in [place] as soon as possible when you return home for rhythm check and INR check. Also need repeat labs for Potassium (level was low while in hospital here).  If you have more episodes of atrial fibrillation where your heart rate is elevated, ok to take Diltiazem 30mg  1 tablet every 6 hours to help control heart rate. This medication is as needed.    We have added a medication called Diltiazem CD 120mg  - you should take this medication daily until seen by your doctors again in [place].

## 2018-01-24 NOTE — Progress Notes (Signed)
ANTICOAGULATION CONSULT NOTE - [place] for warfarin Indication: atrial fibrillation  Allergies  Allergen Reactions  . Codeine     Hives, itching    Patient Measurements: Height: 5\' 4"  (162.6 cm) Weight: 151 lb 14.4 oz (68.9 kg) IBW/kg (Calculated) : 54.7 Heparin Dosing Weight: 69 kg  Vital Signs: Temp: 97.7 F (36.5 C) (11/06 0445) Temp Source: Oral (11/06 0445) BP: 119/80 (11/06 0700) Pulse Rate: 67 (11/06 0700)  Labs: Recent Labs    01/23/18 1139 01/23/18 1141 01/24/18 0549  HGB 12.9  --   --   HCT 39.9  --   --   PLT 168  --   --   LABPROT  --  25.3* 24.2*  INR  --  2.34 2.20  CREATININE 0.70  --  0.81  TROPONINI <0.03  --   --     Estimated Creatinine Clearance: 59.9 mL/min (by C-G formula based on SCr of 0.81 mg/dL).   Medical History: Past Medical History:  Diagnosis Date  . A-fib ([place])   . Arthritis    RA  . 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 Hypertension   . Melanoma ([place])     Medications:  Medications Prior to Admission  Medication Sig Dispense Refill Last Dose  . acetaminophen (TYLENOL) 500 MG tablet Take 1,000 mg by mouth at bedtime as needed (rest).   01/22/2018 at Unknown time  . acetaminophen (TYLENOL) 650 MG CR tablet Take 1,300 mg by mouth at bedtime as needed for pain.   Past Month at Unknown time  . amoxicillin-clavulanate (AUGMENTIN) 875-125 MG tablet Take 1 tablet by mouth 2 (two) times daily.   01/23/2018 at Unknown time  . B Complex Vitamins (B-COMPLEX/B-12) TABS Take 1 tablet by mouth daily.   01/23/2018 at Unknown time  . citalopram (CELEXA) 20 MG tablet Take 20 mg by mouth daily.   01/23/2018 at Unknown time  . methylPREDNISolone (MEDROL) 4 MG tablet Take 4 mg by mouth daily.   01/23/2018 at Unknown time  . Misc Natural Products (OSTEO BI-FLEX JOINT SHIELD PO) Take 1 tablet by mouth 2 (two) times daily.   01/23/2018 at Unknown time  . pantoprazole (PROTONIX) 40 MG tablet Take 40 mg by mouth daily.   01/23/2018 at  Unknown time  . riTUXimab (RITUXAN) 500 MG/50ML injection Inject 10 mg into the vein. Every 8 weeks   01/16/2018  . sotalol (BETAPACE) 80 MG tablet Take 80 mg by mouth 2 (two) times daily.   01/23/2018 at 945 am  . TURMERIC PO Take 1 tablet by mouth daily.   01/22/2018 at Unknown time  . VITAMIN E PO Take 1 tablet by mouth daily.   01/23/2018 at Unknown time  . warfarin (COUMADIN) 2 MG tablet Take 2 mg by mouth daily.   01/22/2018 at [phone] pm    Assessment: 56 yoF with persistent atrial fibrillation with recent failed cardioversion, presented with SOB and AFib RVR. Pharmacy consulted to continue warfarin. INR remains therapeutic on current home regimen, HR now controlled with diltiazem infusion.  PTA warfarin dose warfarin 2 mg PO daily  Goal of Therapy:  INR 2-3 Monitor platelets by anticoagulation protocol: Yes   Plan:  Continue warfarin 2 mg PO daily Monitor daily INR, CBC, clinical course, s/sx of bleed, PO intake, DDI   Arrie Senate, PharmD, BCPS Clinical Pharmacist [phone] Please check AMION for all [place] numbers 01/24/2018

## 2018-01-24 NOTE — Progress Notes (Signed)
  Echocardiogram 2D Echocardiogram has been performed.  Jacqueline Cochran 01/24/2018, 8:46 AM

## 2018-01-24 NOTE — Progress Notes (Addendum)
Discharge instructions (including medications) discussed with and copy provided to patient/caregiver. D/C by private vehicle with sister. All belonging sent with patients. Delayed discharge.  Patient provided form to request medical documents. Form fax to pt PCP.  VSS at discharge. Pt had no further questions.

## 2018-01-24 NOTE — Progress Notes (Signed)
Physician notified: Luetta Nutting, NP EP At: 1330  Regarding: walked with RN. HR up to 147, asymptomatic recovered immediately to 107. Now 88 AF.  Awaiting return response.   Returned Response at: 1331  Order(s): OK to DC, placed PRN mediation order.

## 2019-06-15 IMAGING — CR DG CHEST 2V
2 series · 2 of 2 positions shown · non-contrast
Comparison: None.

CLINICAL DATA: Shortness of breath, cough.

EXAM:
CHEST - 2 VIEW

[w chest lat]
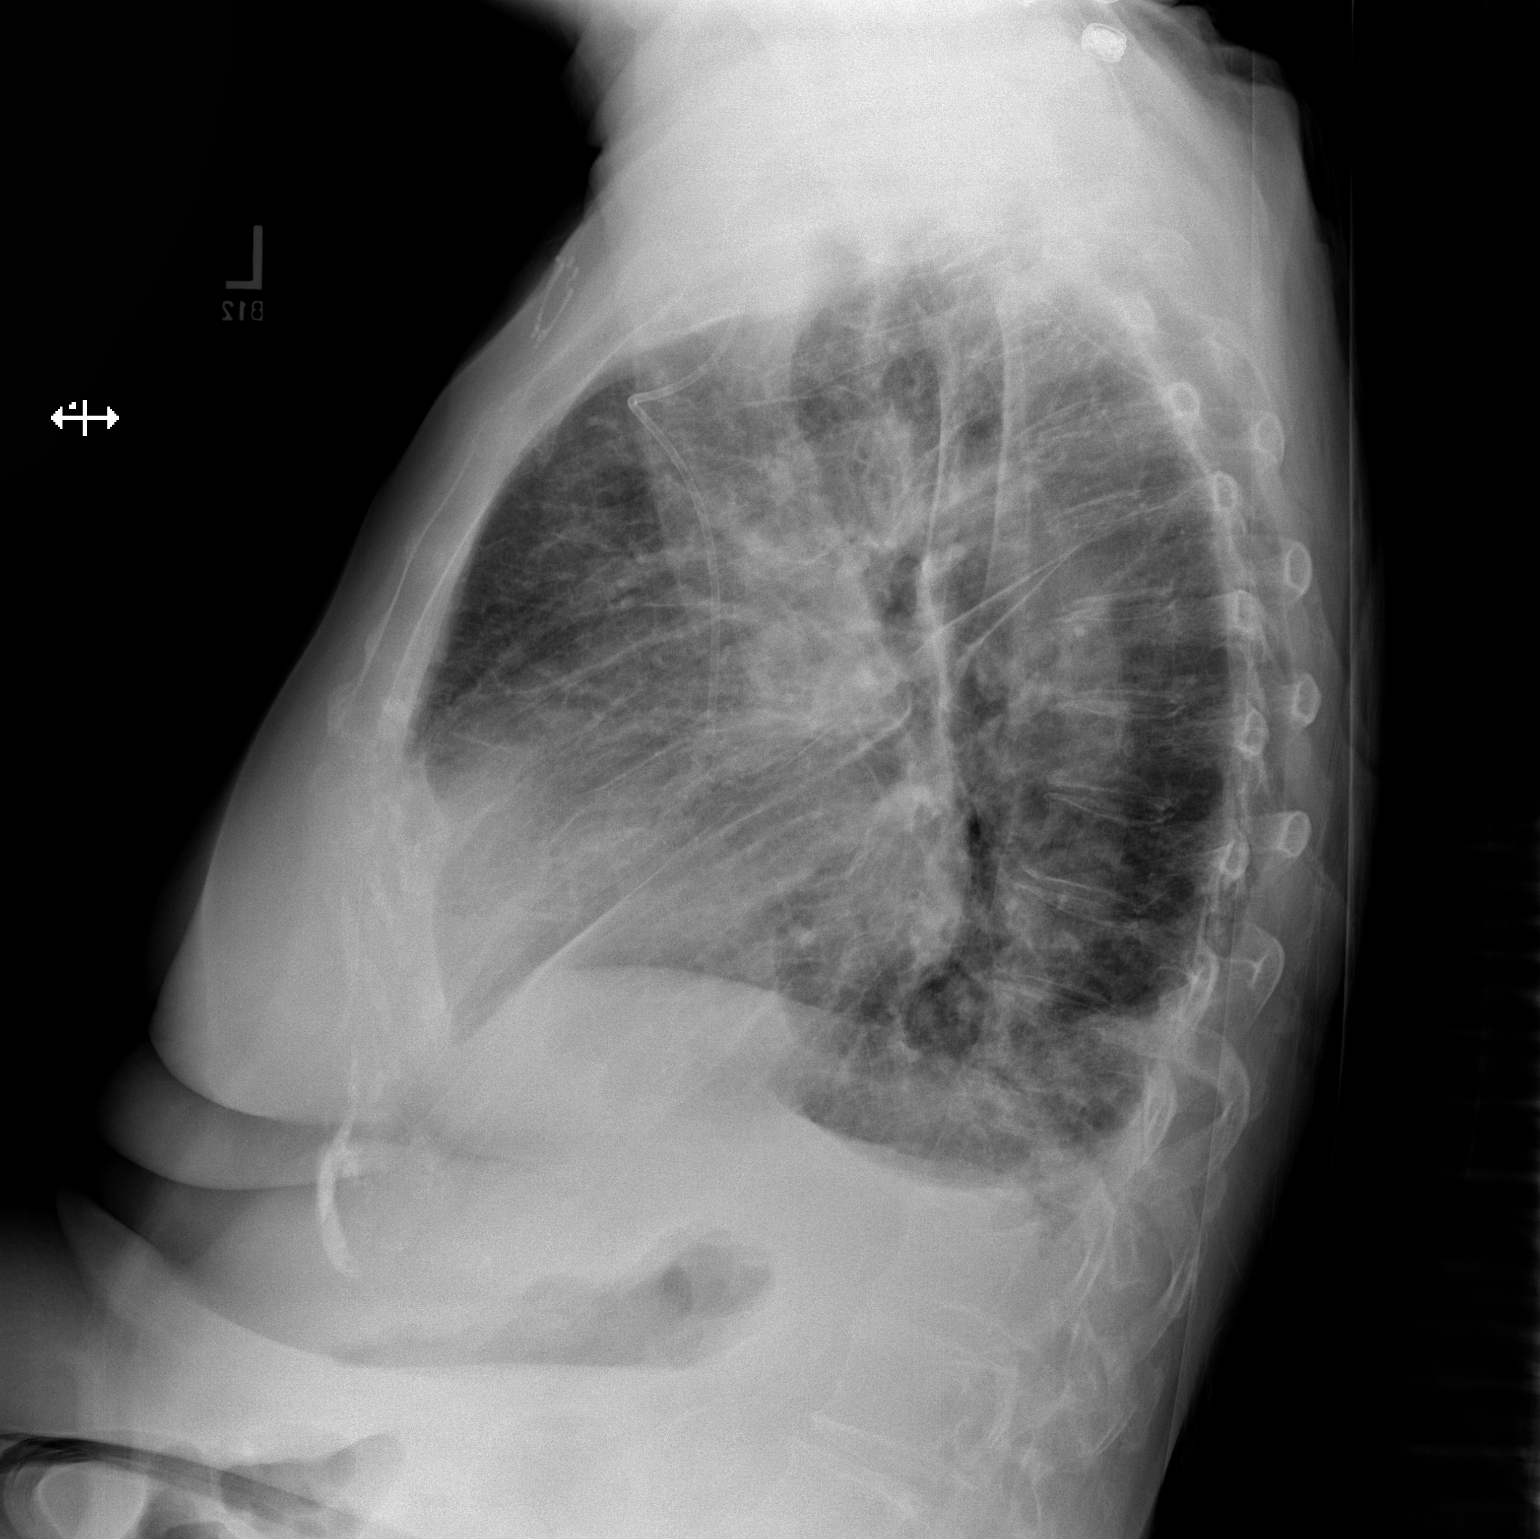

[x chest ap]
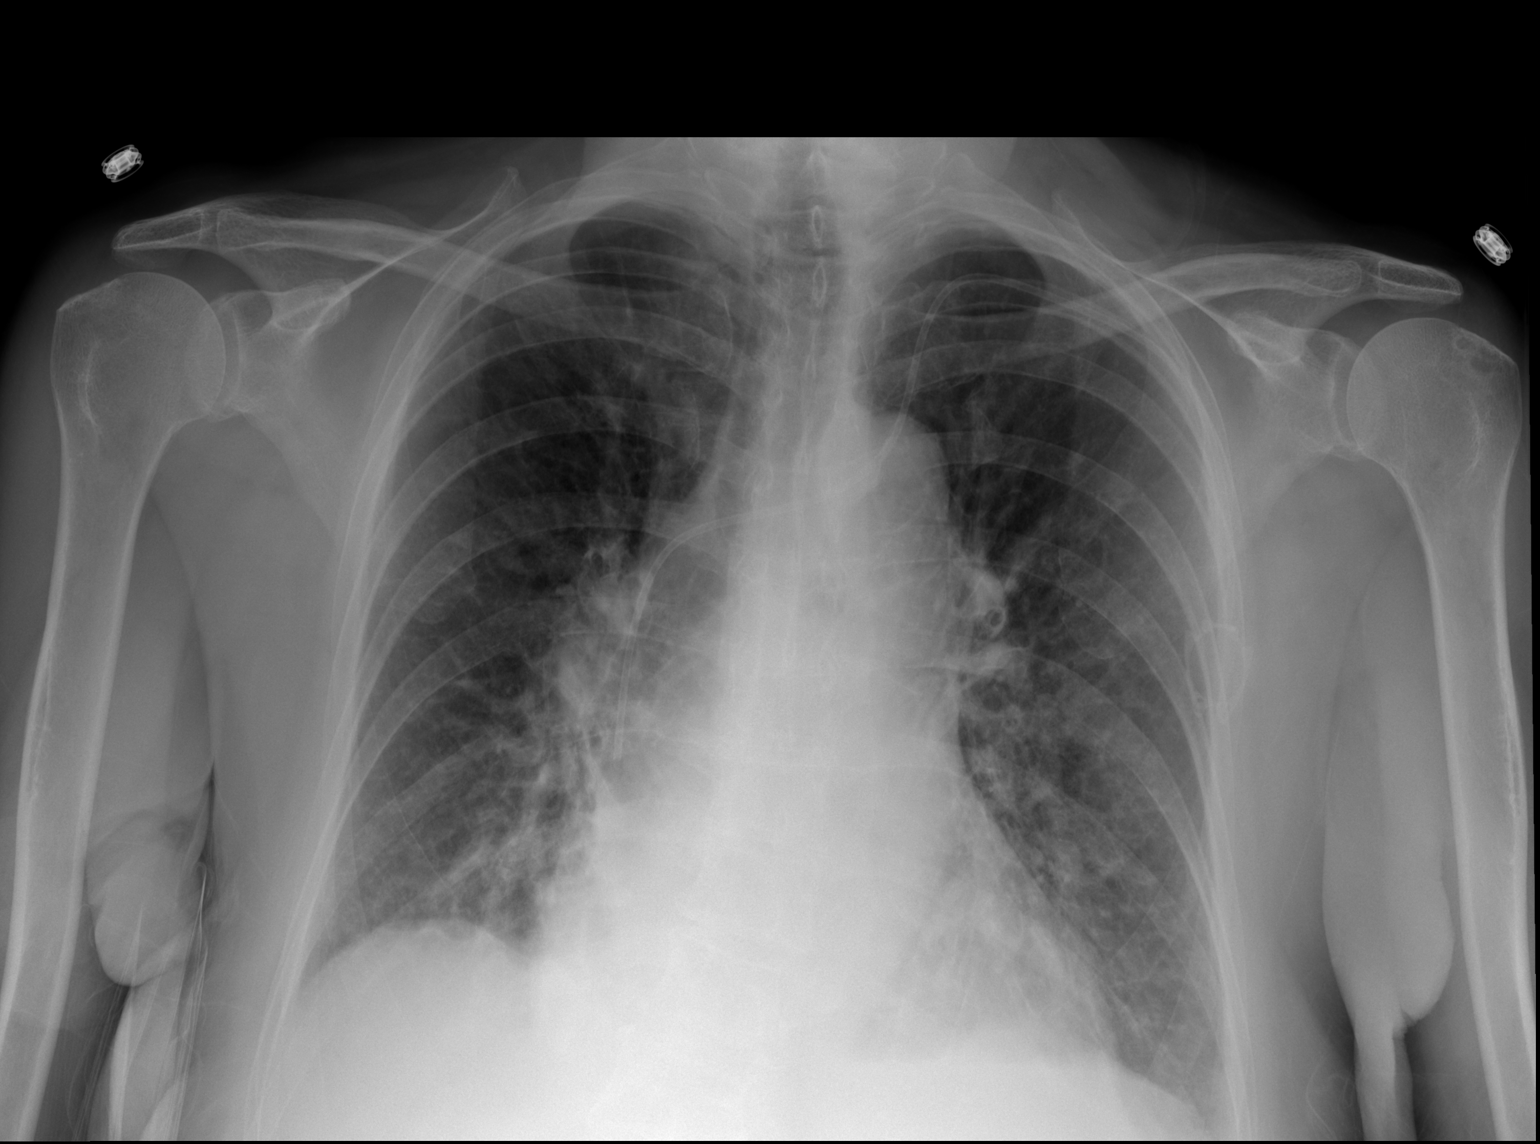

[2 of 2 positions shown; findings below may reference images not displayed]

FINDINGS: Mild cardiomegaly is noted. Left subclavian catheter is noted with
distal tip in expected position of the SVC. Mild central pulmonary
vascular congestion is noted. No pneumothorax is noted. Small left
pleural effusion is noted. Bony thorax is unremarkable.
IMPRESSION: Mild cardiomegaly is noted with mild central pulmonary vascular
congestion. Small left pleural effusion is noted.

## 2022-09-26 ENCOUNTER — Emergency Department (HOSPITAL_COMMUNITY): Payer: Medicare Other

## 2022-09-26 ENCOUNTER — Emergency Department (HOSPITAL_COMMUNITY)
Admission: EM | Admit: 2022-09-26 | Discharge: 2022-09-26 | Disposition: A | Discharge status: Home / Self Care | Payer: Medicare Other | Source: Home / Self Care | Attending: Emergency Medicine | Admitting: Emergency Medicine

## 2022-09-26 ENCOUNTER — Encounter (HOSPITAL_COMMUNITY): Payer: Self-pay | Admitting: Emergency Medicine

## 2022-09-26 DIAGNOSIS — R002 Palpitations: Secondary | ICD-10-CM | POA: Diagnosis present

## 2022-09-26 DIAGNOSIS — I4891 Unspecified atrial fibrillation: Secondary | ICD-10-CM | POA: Diagnosis not present

## 2022-09-26 LAB — CBC WITH DIFFERENTIAL/PLATELET
Abs Immature Granulocytes: 0.02 10*3/uL (ref 0.00–0.07)
Basophils Absolute: 0.1 10*3/uL (ref 0.0–0.1)
Basophils Relative: 1 %
Eosinophils Absolute: 0.2 10*3/uL (ref 0.0–0.5)
Eosinophils Relative: 4 %
HCT: 44.2 % (ref 36.0–46.0)
Hemoglobin: 15 g/dL (ref 12.0–15.0)
Immature Granulocytes: 0 %
Lymphocytes Relative: 27 %
Lymphs Abs: 1.8 10*3/uL (ref 0.7–4.0)
MCH: 33.5 pg (ref 26.0–34.0)
MCHC: 33.9 g/dL (ref 30.0–36.0)
MCV: 98.7 fL (ref 80.0–100.0)
Monocytes Absolute: 0.7 10*3/uL (ref 0.1–1.0)
Monocytes Relative: 11 %
Neutro Abs: 4 10*3/uL (ref 1.7–7.7)
Neutrophils Relative %: 57 %
Platelets: 166 10*3/uL (ref 150–400)
RBC: 4.48 MIL/uL (ref 3.87–5.11)
RDW: 12.5 % (ref 11.5–15.5)
WBC: 6.8 10*3/uL (ref 4.0–10.5)
nRBC: 0 % (ref 0.0–0.2)

## 2022-09-26 LAB — MAGNESIUM: Magnesium: 2.2 mg/dL (ref 1.7–2.4)

## 2022-09-26 LAB — BASIC METABOLIC PANEL
Anion gap: 7 (ref 5–15)
BUN: 17 mg/dL (ref 8–23)
CO2: 27 mmol/L (ref 22–32)
Calcium: 9.2 mg/dL (ref 8.9–10.3)
Chloride: 106 mmol/L (ref 98–111)
Creatinine, Ser: 0.75 mg/dL (ref 0.44–1.00)
GFR, Estimated: >60 mL/min (ref >60)
Glucose, Bld: 98 mg/dL (ref 70–99)
Potassium: 3.8 mmol/L (ref 3.5–5.1)
Sodium: 140 mmol/L (ref 135–145)

## 2022-09-26 LAB — PROTIME-INR
INR: 2.3 — ABNORMAL HIGH (ref 0.8–1.2)
Prothrombin Time: 25.9 seconds — ABNORMAL HIGH (ref 11.4–15.2)

## 2022-09-26 MED ORDER — PROPOFOL 10 MG/ML IV BOLUS
0.5000 mg/kg | Freq: Once | INTRAVENOUS | Status: AC
  Administered 2022-09-26: 34 mg via INTRAVENOUS
  Filled 2022-09-26: qty 20

## 2022-09-26 NOTE — Discharge Instructions (Signed)
Do not drive or operate heavy machinery today as you were given anesthetic medicine to facilitate the cardioversion.  Call your cardiologist to let them know that you had to be cardioverted for atrial fibrillation today.  Continue to take all of your other meds as instructed including your warfarin.  If you develop new or worsening palpitations, chest pain, shortness of breath, or any other new/concerning symptoms then return to the ER or call 911.

## 2022-09-26 NOTE — ED Triage Notes (Signed)
Pt reports Afib issues for 5 years. Pt from [place] and visiting here but noticed she went into afib last night. Pt talked to cardiologist and sent for cardioversion. Cardiologist stated to not change meds, pt taking warfarin and sotalol.

## 2022-09-26 NOTE — Sedation Documentation (Addendum)
Patient cardioverted at 1306. Dr. Criss Alvine bedside. Angie, RT bedside. Gillis Ends, charge RN bedside.  Patient alert and oriented x4 at 1320. Patient verbal and speaking to family bedside.

## 2022-09-26 NOTE — ED Provider Notes (Signed)
[place] EMERGENCY DEPARTMENT AT [facility] Provider Note   CSN: [id number] Arrival date & time: 09/26/22  1135     History  Chief Complaint  Patient presents with   Atrial Fibrillation    Jacqueline Cochran is a 77 y.o. female.  HPI 77 year old female presents with recurrent atrial fibrillation.  She has a longstanding history of paroxysmal A-fib.  She has had multiple ablations and multiple cardioversions.  Her most recent cardioversion was last month and her most recent ablation was a couple months ago.  She is traveling from [place] and did talk to her cardiologist before traveling and he advised that she should get a cardioversion if she feels like she goes back into A-fib.  She has had a couple small, brief episodes of A-fib but since last night around 5 PM she has been in A-fib and has not gone away.  She feels palpitations a little bit of heaviness and dyspnea but she states this is all typical of when she goes into A-fib.  She is on warfarin and has been compliant.  She had a small amount of yogurt this morning but otherwise has been NPO.  She had a little bit of leg swelling the other day from standing on her legs too long but that has gone away.  She denies any other acute illness symptoms.  Home Medications Prior to Admission medications   Medication Sig Start Date End Date Taking? Authorizing Provider  acetaminophen (TYLENOL) 500 MG tablet Take 1,000 mg by mouth at bedtime as needed (rest).    [provider]  acetaminophen (TYLENOL) 650 MG CR tablet Take 1,300 mg by mouth at bedtime as needed for pain.    [provider]  amoxicillin-clavulanate (AUGMENTIN) 875-125 MG tablet Take 1 tablet by mouth 2 (two) times daily.    [provider]  B Complex Vitamins (B-COMPLEX/B-12) TABS Take 1 tablet by mouth daily. 06/27/14   [provider]  citalopram (CELEXA) 20 MG tablet Take 20 mg by mouth daily.    [provider]  diltiazem  (CARDIZEM CD) 120 MG 24 hr capsule Take 1 capsule (120 mg total) by mouth daily. 01/24/18 01/24/19  Marily Lente, NP  diltiazem (CARDIZEM) 30 MG tablet Take 1 tablet (30 mg total) by mouth every 6 (six) hours. 01/24/18   Gypsy Balsam K, NP  methylPREDNISolone (MEDROL) 4 MG tablet Take 4 mg by mouth daily.    [provider]  Misc Natural Products (OSTEO BI-FLEX JOINT SHIELD PO) Take 1 tablet by mouth 2 (two) times daily.    [provider]  pantoprazole (PROTONIX) 40 MG tablet Take 40 mg by mouth daily. 06/27/14   [provider]  riTUXimab (RITUXAN) 500 MG/50ML injection Inject 10 mg into the vein. Every 8 weeks    [provider]  sotalol (BETAPACE) 80 MG tablet Take 80 mg by mouth 2 (two) times daily.    [provider]  TURMERIC PO Take 1 tablet by mouth daily.    [provider]  VITAMIN E PO Take 1 tablet by mouth daily. 06/27/14   [provider]  warfarin (COUMADIN) 2 MG tablet Take 2 mg by mouth daily.    [provider]      Allergies    Codeine    Review of Systems   Review of Systems  Constitutional:  Negative for fever.  Respiratory:  Negative for cough.   Cardiovascular:  Positive for palpitations.  Gastrointestinal:  Negative for  diarrhea and vomiting.    Physical Exam Updated Vital Signs BP 138/73   Pulse 61   Temp 97.8 F (36.6 C) (Oral)   Resp 17   Ht 5\' 4"  (1.626 m)   Wt 68 kg   SpO2 94%   BMI 25.73 kg/m  Physical Exam Vitals and nursing note reviewed.  Constitutional:      General: She is not in acute distress.    Appearance: She is well-developed. She is not ill-appearing or diaphoretic.  HENT:     Head: Normocephalic and atraumatic.  Cardiovascular:     Rate and Rhythm: Tachycardia present. Rhythm irregular.     Heart sounds: Normal heart sounds.  Pulmonary:     Effort: Pulmonary effort is normal.     Breath sounds: Normal breath sounds.  Abdominal:     Palpations: Abdomen is  soft.     Tenderness: There is no abdominal tenderness.  Musculoskeletal:     Right lower leg: No edema.     Left lower leg: No edema.  Skin:    General: Skin is warm and dry.  Neurological:     Mental Status: She is alert.     ED Results / Procedures / Treatments   Labs (all labs ordered are listed, but only abnormal results are displayed) Labs Reviewed  PROTIME-INR - Abnormal; Notable for the following components:      Result Value   Prothrombin Time 25.9 (*)    INR 2.3 (*)    All other components within normal limits  CBC WITH DIFFERENTIAL/PLATELET  BASIC METABOLIC PANEL  MAGNESIUM    EKG EKG Interpretation Date/Time:  Monday September 26 2022 13:07:11 EDT Ventricular Rate:  63 PR Interval:  151 QRS Duration:  97 QT Interval:  363 QTC Calculation: 372 R Axis:   51  Text Interpretation: Sinus arrhythmia Multiple premature complexes, vent & supraven Borderline repol abnormality, diffuse leads afib no longer present Confirmed by Pricilla Loveless [phone]) on 09/26/2022 1:17:03 PM  Radiology DG Chest Portable 1 View  Result Date: 09/26/2022 CLINICAL DATA:  Atrial fibrillation EXAM: PORTABLE CHEST 1 VIEW COMPARISON:  01/23/2018 FINDINGS: Heart size is normal. Chronic aortic atherosclerosis and tortuosity. Pulmonary vascularity is normal. The lungs are clear. No effusions. No acute bone finding. IMPRESSION: No active disease. Aortic atherosclerosis. Electronically Signed   By: Paulina Fusi M.D.   On: 09/26/2022 12:25    Procedures .Sedation  Date/Time: 09/26/2022 1:15 PM  Performed by: Pricilla Loveless, MD Authorized by: Pricilla Loveless, MD   Consent:    Consent obtained:  Verbal and written   Consent given by:  Patient Universal protocol:    Immediately prior to procedure, a time out was called: yes     Patient identity confirmed:  Verbally with patient Indications:    Procedure performed:  Cardioversion   Procedure necessitating sedation performed by:  Physician performing  sedation Pre-sedation assessment:    Time since last food or drink:  4 hours   NPO status caution: urgency dictates proceeding with non-ideal NPO status     ASA classification: class 3 - patient with severe systemic disease     Mallampati score:  I - soft palate, uvula, fauces, pillars visible   Pre-sedation assessments completed and reviewed: airway patency, cardiovascular function, hydration status, mental status, nausea/vomiting, pain level, respiratory function and temperature   Immediate pre-procedure details:    Reviewed: vital signs, relevant labs/tests and NPO status     Verified: bag valve mask available, emergency equipment available, intubation  equipment available, IV patency confirmed, oxygen available and suction available   Procedure details (see MAR for exact dosages):    Preoxygenation:  Nasal cannula   Sedation:  Propofol   Intended level of sedation: deep   Intra-procedure monitoring:  Blood pressure monitoring, cardiac monitor, continuous capnometry, continuous pulse oximetry, frequent vital sign checks and frequent LOC assessments   Intra-procedure events: none     Total Provider sedation time (minutes):  10 Post-procedure details:    Attendance: Constant attendance by certified staff until patient recovered     Recovery: Patient returned to pre-procedure baseline     Post-sedation assessments completed and reviewed: airway patency, cardiovascular function, hydration status, mental status, nausea/vomiting, pain level, respiratory function and temperature     Patient is stable for discharge or admission: yes     Procedure completion:  Tolerated well, no immediate complications     Medications Ordered in ED Medications  propofol (DIPRIVAN) 10 mg/mL bolus/IV push 34 mg (34 mg Intravenous Given 09/26/22 1304)    ED Course/ Medical Decision Making/ A&P                             Medical Decision Making Amount and/or Complexity of Data Reviewed External Data  Reviewed: notes. Labs: ordered.    Details: Unremarkable electrolytes Radiology: ordered.    Details: No CHF ECG/medicine tests: ordered and independent interpretation performed.    Details: Afib   Patient presents with recurrent atrial fibrillation.  She went into A-fib last night and does have a paroxysmal nature to her A-fib.  She is also been therapeutic with INR and so I think cardioversion is reasonable per her cardiologist request.  She tolerated this well.  Has converted to normal sinus rhythm and stayed in this.  She will continue her home meds and follow-up with her cardiologist.  Stable for discharge.  No anginal symptoms.  CHA2DS2/VAS Stroke Risk Points  Current as of about an hour ago     4 >= 2 Points: High Risk  1 - 1.99 Points: Medium Risk  0 Points: Low Risk    Last Change: N/A      Details    This score determines the patient's risk of having a stroke if the  patient has atrial fibrillation.       Points Metrics  1 Has Congestive Heart Failure:  Yes    Current as of about an hour ago  0 Has Vascular Disease:  No    Current as of about an hour ago  0 Has Hypertension:  No    Current as of about an hour ago  2 Age:  74    Current as of about an hour ago  0 Has Diabetes:  No    Current as of about an hour ago  0 Had Stroke:  No  Had TIA:  No  Had Thromboembolism:  No    Current as of about an hour ago  1 Female:  Yes    Current as of about an hour ago                  Final Clinical Impression(s) / ED Diagnoses Final diagnoses:  Atrial fibrillation with RVR (HCC)    Rx / DC Orders ED Discharge Orders     None         Pricilla Loveless, MD 09/26/22 1631

## 2022-09-26 NOTE — ED Notes (Signed)
An After Visit Summary was printed and given to the patient. Discharge instructions given and no further questions at this time.  Pt A&Ox4, ambulatory with a steady gait. Pt leaving with sister.
